# Patient Record
Sex: Male | Born: 1990 | Race: White | Hispanic: No | Marital: Single | Smoking: Never smoker
Health system: Southern US, Community
[De-identification: ages and names within clinical notes are randomized; demographics above are authoritative.]

## PROBLEM LIST (undated history)

## (undated) DIAGNOSIS — I10 Essential (primary) hypertension: Secondary | ICD-10-CM

---

## 2015-08-28 ENCOUNTER — Encounter: Payer: Self-pay | Admitting: Emergency Medicine

## 2015-08-28 ENCOUNTER — Emergency Department
Admission: EM | Admit: 2015-08-28 | Discharge: 2015-08-28 | Disposition: A | Discharge status: Home / Self Care | Payer: Worker's Compensation | Attending: Emergency Medicine | Admitting: Emergency Medicine

## 2015-08-28 DIAGNOSIS — Y92410 Unspecified street and highway as the place of occurrence of the external cause: Secondary | ICD-10-CM | POA: Insufficient documentation

## 2015-08-28 DIAGNOSIS — S86811A Strain of other muscle(s) and tendon(s) at lower leg level, right leg, initial encounter: Secondary | ICD-10-CM | POA: Diagnosis not present

## 2015-08-28 DIAGNOSIS — Y998 Other external cause status: Secondary | ICD-10-CM | POA: Diagnosis not present

## 2015-08-28 DIAGNOSIS — Y9389 Activity, other specified: Secondary | ICD-10-CM | POA: Diagnosis not present

## 2015-08-28 DIAGNOSIS — S86111A Strain of other muscle(s) and tendon(s) of posterior muscle group at lower leg level, right leg, initial encounter: Secondary | ICD-10-CM

## 2015-08-28 DIAGNOSIS — S8991XA Unspecified injury of right lower leg, initial encounter: Secondary | ICD-10-CM | POA: Diagnosis present

## 2015-08-28 MED ORDER — METHOCARBAMOL 750 MG PO TABS: 750.0000 mg | ORAL_TABLET | Freq: Four times a day (QID) | ORAL | Status: AC

## 2015-08-28 MED ORDER — NAPROXEN 500 MG PO TABS: 500.0000 mg | ORAL_TABLET | Freq: Two times a day (BID) | ORAL | Status: AC
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
## 2015-08-28 NOTE — Discharge Instructions (Signed)

## 2015-08-28 NOTE — ED Notes (Signed)
Urine drug screen and alcohol breathelizer completed by ED tech Pam.
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
## 2016-11-07 ENCOUNTER — Emergency Department: Payer: Worker's Compensation

## 2016-11-07 ENCOUNTER — Emergency Department
Admission: EM | Admit: 2016-11-07 | Discharge: 2016-11-07 | Disposition: A | Discharge status: Home / Self Care | Payer: Worker's Compensation | Attending: Emergency Medicine | Admitting: Emergency Medicine

## 2016-11-07 DIAGNOSIS — S32030A Wedge compression fracture of third lumbar vertebra, initial encounter for closed fracture: Secondary | ICD-10-CM | POA: Insufficient documentation

## 2016-11-07 DIAGNOSIS — Y990 Civilian activity done for income or pay: Secondary | ICD-10-CM | POA: Diagnosis not present

## 2016-11-07 DIAGNOSIS — M545 Low back pain, unspecified: Secondary | ICD-10-CM

## 2016-11-07 DIAGNOSIS — S3992XA Unspecified injury of lower back, initial encounter: Secondary | ICD-10-CM | POA: Diagnosis present

## 2016-11-07 DIAGNOSIS — S32010A Wedge compression fracture of first lumbar vertebra, initial encounter for closed fracture: Secondary | ICD-10-CM | POA: Insufficient documentation

## 2016-11-07 DIAGNOSIS — I10 Essential (primary) hypertension: Secondary | ICD-10-CM | POA: Diagnosis not present

## 2016-11-07 DIAGNOSIS — Y929 Unspecified place or not applicable: Secondary | ICD-10-CM | POA: Insufficient documentation

## 2016-11-07 DIAGNOSIS — W11XXXA Fall on and from ladder, initial encounter: Secondary | ICD-10-CM | POA: Diagnosis not present

## 2016-11-07 DIAGNOSIS — S32009A Unspecified fracture of unspecified lumbar vertebra, initial encounter for closed fracture: Secondary | ICD-10-CM

## 2016-11-07 DIAGNOSIS — Y9389 Activity, other specified: Secondary | ICD-10-CM | POA: Insufficient documentation

## 2016-11-07 DIAGNOSIS — S32020A Wedge compression fracture of second lumbar vertebra, initial encounter for closed fracture: Secondary | ICD-10-CM | POA: Insufficient documentation

## 2016-11-07 DIAGNOSIS — Q7649 Other congenital malformations of spine, not associated with scoliosis: Secondary | ICD-10-CM

## 2016-11-07 HISTORY — DX: Essential (primary) hypertension: I10

## 2016-11-07 LAB — LIPASE, BLOOD: LIPASE: 19 U/L (ref 11–51)

## 2016-11-07 LAB — COMPREHENSIVE METABOLIC PANEL
ALT: 71 U/L — ABNORMAL HIGH (ref 17–63)
AST: 58 U/L — AB (ref 15–41)
Albumin: 5.1 g/dL — ABNORMAL HIGH (ref 3.5–5.0)
Alkaline Phosphatase: 73 U/L (ref 38–126)
Anion gap: 14 (ref 5–15)
BUN: 27 mg/dL — ABNORMAL HIGH (ref 6–20)
CHLORIDE: 108 mmol/L (ref 101–111)
CO2: 20 mmol/L — ABNORMAL LOW (ref 22–32)
Calcium: 9.6 mg/dL (ref 8.9–10.3)
Creatinine, Ser: 1.18 mg/dL (ref 0.61–1.24)
Glucose, Bld: 114 mg/dL — ABNORMAL HIGH (ref 65–99)
POTASSIUM: 3.9 mmol/L (ref 3.5–5.1)
Sodium: 142 mmol/L (ref 135–145)
Total Bilirubin: 0.7 mg/dL (ref 0.3–1.2)
Total Protein: 8.2 g/dL — ABNORMAL HIGH (ref 6.5–8.1)

## 2016-11-07 LAB — URINALYSIS, COMPLETE (UACMP) WITH MICROSCOPIC
BILIRUBIN URINE: NEGATIVE
Bacteria, UA: NONE SEEN
Glucose, UA: NEGATIVE mg/dL
HGB URINE DIPSTICK: NEGATIVE
Ketones, ur: NEGATIVE mg/dL
LEUKOCYTES UA: NEGATIVE
Nitrite: NEGATIVE
PROTEIN: 30 mg/dL — AB
SPECIFIC GRAVITY, URINE: 1.025 (ref 1.005–1.030)
SQUAMOUS EPITHELIAL / LPF: NONE SEEN
pH: 5 (ref 5.0–8.0)

## 2016-11-07 LAB — CBC
HCT: 47.4 % (ref 40.0–52.0)
Hemoglobin: 16.5 g/dL (ref 13.0–18.0)
MCH: 31 pg (ref 26.0–34.0)
MCHC: 34.9 g/dL (ref 32.0–36.0)
MCV: 88.7 fL (ref 80.0–100.0)
PLATELETS: 153 10*3/uL (ref 150–440)
RBC: 5.34 MIL/uL (ref 4.40–5.90)
RDW: 12.6 % (ref 11.5–14.5)
WBC: 26.2 10*3/uL — AB (ref 3.8–10.6)

## 2016-11-07 MED ORDER — HYDROCODONE-ACETAMINOPHEN 5-325 MG PO TABS: 1.0000 | ORAL_TABLET | Freq: Four times a day (QID) | ORAL | 0 refills | Status: AC | PRN

## 2016-11-07 MED ORDER — KETOROLAC TROMETHAMINE 30 MG/ML IJ SOLN
30.0000 mg | Freq: Once | INTRAMUSCULAR | Status: AC
  Administered 2016-11-07: 30 mg via INTRAVENOUS
  Filled 2016-11-07: qty 1

## 2016-11-07 MED ORDER — IOPAMIDOL (ISOVUE-300) INJECTION 61%
100.0000 mL | Freq: Once | INTRAVENOUS | Status: AC | PRN
  Administered 2016-11-07: 100 mL via INTRAVENOUS

## 2016-11-07 MED ORDER — MORPHINE SULFATE (PF) 4 MG/ML IV SOLN
4.0000 mg | Freq: Once | INTRAVENOUS | Status: AC
  Administered 2016-11-07: 4 mg via INTRAVENOUS
  Filled 2016-11-07: qty 1

## 2016-11-07 MED ORDER — ONDANSETRON HCL 4 MG/2ML IJ SOLN
4.0000 mg | Freq: Once | INTRAMUSCULAR | Status: AC
  Administered 2016-11-07: 4 mg via INTRAVENOUS
  Filled 2016-11-07: qty 2

## 2016-11-07 NOTE — ED Notes (Signed)
Ronalee Belts with [employer] here to apply TLSO brace.

## 2016-11-07 NOTE — ED Triage Notes (Signed)
Pt fell from roof at work, straight onto back. Pt brought in by [facility] in c collar. Pt only complaint is lower back dull pain. No LOC. Denies neck pain. Pt able to move all extremities with ease. Pt alert and oriented X4, active, cooperative, pt in NAD. RR even and unlabored, color WNL.

## 2016-11-07 NOTE — ED Notes (Signed)
Pt in ct scan  

## 2016-11-07 NOTE — ED Notes (Signed)
Pt remains in c-collar.

## 2016-11-07 NOTE — ED Notes (Signed)
ED Provider at bedside. 

## 2016-11-07 NOTE — ED Notes (Signed)
Call placed to supervisor at [employer] to page ortho tech for TLSO brace. Po fluids provided to pt. Sarah, ed tech in to perform UDS.

## 2016-11-07 NOTE — ED Notes (Signed)
Pt's suppervisor, dean now [employer] is requiring "blood work" for [employer] claim. Supervisor is unsure of blood work needs, states "they said they want a drug screen and some alcohol". Pt's HR supervisor is present on phone speaking with insurance company to clarify what is needed.

## 2016-11-07 NOTE — ED Notes (Signed)
Report from ally, rn.  

## 2016-11-07 NOTE — ED Notes (Addendum)
Pt supervisor, Pat Patrick contacted number [phone]. States that no UDS or breath analysis needed for patient to file workers comp.  Pt workers for [employer].

## 2016-11-07 NOTE — ED Notes (Signed)
c collar removed by EDP. No pain to neck. Full ROM of neck

## 2016-11-07 NOTE — ED Provider Notes (Signed)
[facility] Emergency Department Provider Note   ____________________________________________   First MD Initiated Contact with Patient 11/07/16 1747     (approximate)  I have reviewed the triage vital signs and the nursing notes.   HISTORY  Chief Complaint Fall    HPI Juan Craig is a 26 y.o. male presents for evaluation after falling about 10 feet off a ladder.  Patient reports that he was getting off a roof, he is about 1 story up when the ladder he was on came loose, he fell onto his back onto the ground. He struck on his buttock and lower back region. He did not strike his head or neck. Denies pain in his upper back or chest. No abdominal pain.  He has not had any alcohol or drugs today. Denies any numbness or tingling. No weakness in his arms or legs. No trouble urinating.  Patient reports a moderate 6 out of 10 pain in his lower back whenever he tries to move.   Past Medical History:  Diagnosis Date  . Hypertension     There are no active problems to display for this patient.   History reviewed. No pertinent surgical history.  Prior to Admission medications   Medication Sig Start Date End Date Taking? Authorizing Provider  HYDROcodone-acetaminophen (NORCO/VICODIN) 5-325 MG tablet Take 1-2 tablets by mouth every 6 (six) hours as needed for moderate pain. 11/07/16   Delman Kitten, MD  methocarbamol (ROBAXIN-750) 750 MG tablet Take 1 tablet (750 mg total) by mouth 4 (four) times daily. Patient not taking: Reported on 11/07/2016 08/28/15   Sable Feil, PA-C  naproxen (NAPROSYN) 500 MG tablet Take 1 tablet (500 mg total) by mouth 2 (two) times daily with a meal. Patient not taking: Reported on 11/07/2016 08/28/15   Sable Feil, PA-C    Allergies Patient has no known allergies.  No family history on file.  Social History Social History  Substance Use Topics  . Smoking status: Never Smoker  . Smokeless tobacco: Never Used  . Alcohol use  Yes    Review of Systems Constitutional: No fever/chills Eyes: No visual changes. ENT: No sore throat. Cardiovascular: Denies chest pain. Respiratory: Denies shortness of breath. Gastrointestinal: No abdominal pain.  No nausea, no vomiting.  No diarrhea.  No constipation. Genitourinary: Negative for dysuria. Musculoskeletal: See history of present illness Skin: Negative for rash. Neurological: Negative for headaches, focal weakness or numbness.  10-point ROS otherwise negative.  ____________________________________________   PHYSICAL EXAM:  VITAL SIGNS: ED Triage Vitals  Enc Vitals Group     BP 11/07/16 1654 115/73     Pulse Rate 11/07/16 1654 75     Resp 11/07/16 1654 18     Temp 11/07/16 1654 98.2 F (36.8 C)     Temp Source 11/07/16 1654 Oral     SpO2 11/07/16 1654 100 %     Weight 11/07/16 1655 235 lb (106.6 kg)     Height 11/07/16 1655 6' (1.829 m)     Head Circumference --      Peak Flow --      Pain Score 11/07/16 1654 5     Pain Loc --      Pain Edu? --      Excl. in [place]? --     Constitutional: Alert and oriented. Well appearing and in no acute distress. Eyes: Conjunctivae are normal. PERRL. EOMI. Head: Atraumatic. Nose: No congestion/rhinnorhea. Mouth/Throat: Mucous membranes are moist.  Oropharynx non-erythematous. Neck: No stridor.  No midline cervical tenderness to palpation. No deformities. With cervical collar removed, the patient has full range of motion of the neck without pain. No pain on axial loading. Cleared by [facility]: Normal rate, regular rhythm. Grossly normal heart sounds.  Good peripheral circulation. Respiratory: Normal respiratory effort.  No retractions. Lungs CTAB. Gastrointestinal: Soft and nontender. No distention.  Musculoskeletal: No lower extremity tenderness nor edema.  No joint effusions. 5 out of 5 strength in both extremities upper and lower. No numbness or tingling in any extremity, hands or feet. 5 out of 5  strength in all major muscle groups. Dorsi and plantar flexion. Intact and normal dorsalis pedis and posterior tibial pulses bilateral. The patient was log rolled, he does report tenderness in the upper portion of the lumbar spine, without notable deformity. Neurologic:  Normal speech and language. No gross focal neurologic deficits are appreciated.Skin:  Skin is warm, dry and intact. No rash noted. Psychiatric: Mood and affect are normal. Speech and behavior are normal.  ____________________________________________   LABS (all labs ordered are listed, but only abnormal results are displayed)  Labs Reviewed  CBC - Abnormal; Notable for the following:       Result Value   WBC 26.2 (*)    All other components within normal limits  COMPREHENSIVE METABOLIC PANEL - Abnormal; Notable for the following:    CO2 20 (*)    Glucose, Bld 114 (*)    BUN 27 (*)    Total Protein 8.2 (*)    Albumin 5.1 (*)    AST 58 (*)    ALT 71 (*)    All other components within normal limits  URINALYSIS, COMPLETE (UACMP) WITH MICROSCOPIC - Abnormal; Notable for the following:    Color, Urine YELLOW (*)    APPearance HAZY (*)    Protein, ur 30 (*)    All other components within normal limits  LIPASE, BLOOD   ____________________________________________  EKG   ____________________________________________  RADIOLOGY  Ct Abdomen Pelvis W Contrast  Result Date: 11/07/2016 CLINICAL DATA:  Lower back pain after fall greater than 10 feet. Initial encounter. EXAM: CT ABDOMEN AND PELVIS WITH CONTRAST TECHNIQUE: Multidetector CT imaging of the abdomen and pelvis was performed using the standard protocol following bolus administration of intravenous contrast. CONTRAST:  143mL ISOVUE-300 IOPAMIDOL (ISOVUE-300) INJECTION 61% COMPARISON:  None. FINDINGS: Lower chest:  No contributory findings. Hepatobiliary: No evidence of injury.No evidence of biliary obstruction or stone. Pancreas: Unremarkable. Spleen: Unremarkable.  Adrenals/Urinary Tract: No evidence of injury. Negative adrenals. No hydronephrosis or stone. Unremarkable bladder. Stomach/Bowel:  No evidence of injury. Vascular/Lymphatic: No acute vascular abnormality. Subtle focal right external iliac irregularity is related to streak artifact based on sagittal reformats. No mass or adenopathy. Reproductive:Negative Other: No ascites or pneumoperitoneum. Musculoskeletal: Superior endplate fractures of L1, L2, and L3 as described on dedicated lumbar spine reformats. Height loss measures up to 30% at L2. 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 No retropulsion. Paravertebral edema about the fractures. IMPRESSION: 1. Superior endplate fractures of L1, L2, and L3 with up to 30% height loss. No bony retropulsion or subluxation. 2. No evidence of intra-abdominal injury. Electronically Signed   By: Monte Fantasia M.D.   On: 11/07/2016 19:37   Ct L-spine No Charge  Result Date: 11/07/2016 CLINICAL DATA:  Fall with back pain.  Initial encounter. EXAM: CT LUMBAR SPINE WITHOUT CONTRAST TECHNIQUE: Reformats of the lumbar spine were generated from abdomen and pelvis CT. COMPARISON:  None. FINDINGS: Segmentation: 5 lumbar  type vertebrae. Alignment: Normal. Vertebrae: Acute superior endplate fractures of L1, L2, and L3. Height loss measures up to 30% at L2. No retropulsion or subluxation. Paraspinal and other soft tissues: Paravertebral edema at the levels of fracture. 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 There may be a central disc protrusion at L4-5. IMPRESSION: 1. Acute superior endplate fractures of L1, L2, and L3. Height loss measures up to 30% at L2. No retropulsion or subluxation. 2. Possible central disc protrusion at L4-5. 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 Electronically Signed   By: Monte Fantasia M.D.   On: 11/07/2016 19:41   Dg Chest Portable 1 View  Result Date: 11/07/2016 CLINICAL DATA:   Fall EXAM: PORTABLE CHEST 1 VIEW COMPARISON:  None. FINDINGS: The heart size and mediastinal contours are within normal limits. Both lungs are clear. The visualized skeletal structures are unremarkable. IMPRESSION: No active disease. Electronically Signed   By: Kerby Moors M.D.   On: 11/07/2016 18:24    ____________________________________________   PROCEDURES  Procedure(s) performed: None  Procedures  Critical Care performed: No  ____________________________________________   INITIAL IMPRESSION / ASSESSMENT AND PLAN / ED COURSE  Pertinent labs & imaging results that were available during my care of the patient were reviewed by me and considered in my medical decision making (see chart for details).    Clinical Course as of Nov 08 2254  Fri Nov 07, 2016  2011 Discussed case with Dr. Arnoldo Morale of neurosurgery at [facility]. He recommends after reviewing the patient's imaging studies that he have a TLSO brace applied to be worn at all times except in the shower. She may follow-up with him in one week in the neurosurgery clinic.  [MQ]  2134 The patient is resting comfortably. Reports his pain is well-controlled. Awaiting TLSO brace to arrive from [place].  I will prescribe the patient a narcotic pain medicine due to their condition which I anticipate will cause at least moderate pain short term. I discussed with the patient safe use of narcotic pain medicines, and that they are not to drive, work in dangerous areas, or ever take more than prescribed (no more than 1 pill every 6 hours). We discussed that this is the type of medication that can be  overdosed on and the risks of this type of medicine. Patient is very agreeable to only use as prescribed and to never use more than prescribed.   [MQ]    Clinical Course User Index [MQ] Delman Kitten, MD   Patient presents after falling from a ladder about 10 feet. He landed on his back and buttock region. Reports pain in his upper lumbar  region, denies any pain in his thoracic or cervical spine. Denies striking his head or his neck. Reports he fell essentially under his lower back and buttock. No evidence of injury extremities. He is completely neurologically intact. Given the injury, we'll screen for thoracic injury with chest x-ray though very low pretest probability for thoracic spine or chest injury, we'll obtain CT scan abdomen and pelvis including lumbar reconstructions given his lower back pain with an elevated risk for possible spinal injury. No abdominal complaints.  Takes no anticoagulants.  ----------------------------------------- 10:59 PM on 11/07/2016 -----------------------------------------  Patient able to handle it well now a TLSO brace. Reports his pain is very minimal, doesn't require any more pain medicine. Normal and intact strength in the lower extremities. Very careful return precautions advised, and he will follow up closely with Dr.  Jenkins.  Return precautions and treatment recommendations and follow-up discussed with the patient who is agreeable with the plan.   ____________________________________________   FINAL CLINICAL IMPRESSION(S) / ED DIAGNOSES  Final diagnoses:  Low back pain  Defect of endplate of vertebra  Closed fracture of lumbar spine without spinal cord lesion, initial encounter ([place])      NEW MEDICATIONS STARTED DURING THIS VISIT:  New Prescriptions   HYDROCODONE-ACETAMINOPHEN (NORCO/VICODIN) 5-325 MG TABLET    Take 1-2 tablets by mouth every 6 (six) hours as needed for moderate pain.     Note:  This document was prepared using Dragon voice recognition software and may include unintentional dictation errors.     Delman Kitten, MD 11/07/16 2259

## 2016-11-07 NOTE — ED Notes (Signed)
Pt's employer supervisor states pt now needs a UDS, but not other lab work needed.

## 2016-11-07 NOTE — ED Notes (Signed)
Pt would like to file workers comp, tells nurse at this time.

## 2017-03-11 DIAGNOSIS — D2262 Melanocytic nevi of left upper limb, including shoulder: Secondary | ICD-10-CM | POA: Diagnosis not present

## 2017-04-06 DIAGNOSIS — Z0000 Encounter for general adult medical examination without abnormal findings: Secondary | ICD-10-CM | POA: Diagnosis not present

## 2017-04-06 DIAGNOSIS — Z13220 Encounter for screening for lipoid disorders: Secondary | ICD-10-CM | POA: Diagnosis not present

## 2017-04-06 DIAGNOSIS — I10 Essential (primary) hypertension: Secondary | ICD-10-CM | POA: Diagnosis not present

## 2017-04-06 DIAGNOSIS — Z79899 Other long term (current) drug therapy: Secondary | ICD-10-CM | POA: Diagnosis not present

## 2019-03-13 IMAGING — CT CT ABD-PELV W/ CM
2 of 5 series · 16 of 46 positions shown, 18 images · IV contrast (iopamidol)
Comparison: None.

CLINICAL DATA: Lower back pain after fall greater than 10 feet.
Initial encounter.

EXAM:
CT ABDOMEN AND PELVIS WITH CONTRAST
TECHNIQUE: Multidetector CT imaging of the abdomen and pelvis was performed
using the standard protocol following bolus administration of
intravenous contrast.
CONTRAST:  100mL E48NJU-OII IOPAMIDOL (E48NJU-OII) INJECTION 61%

[Series 3: routine abd/pel with · axial · 0.83mm/px · z∈[-317,+148]mm · 13 of 105 slices shown, 15 images]
[im 6/105  soft-tissue]
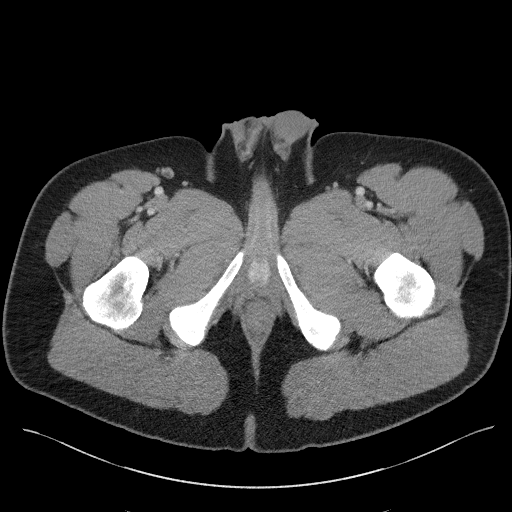
[im 6/105  bone]
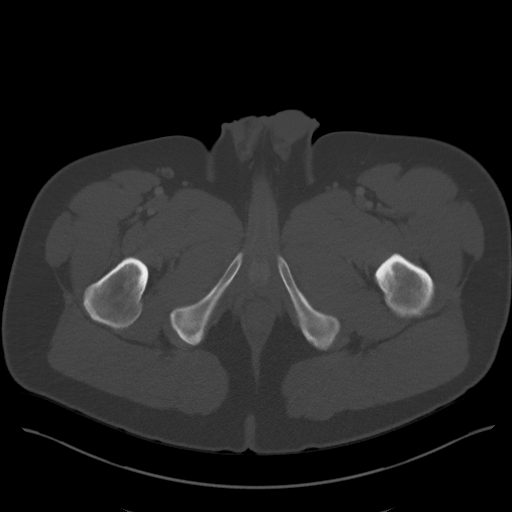
[im 17/105  soft-tissue]
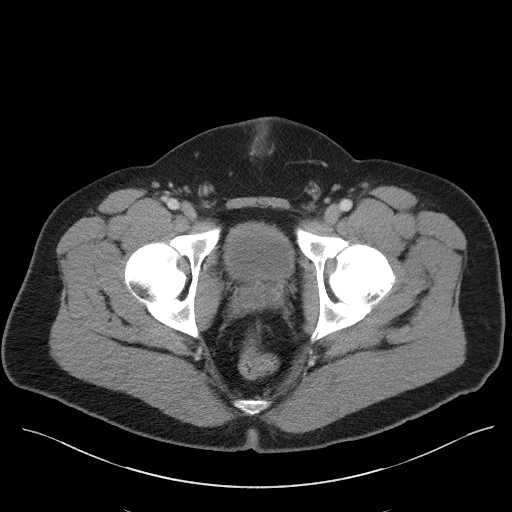
[im 22/105  soft-tissue]
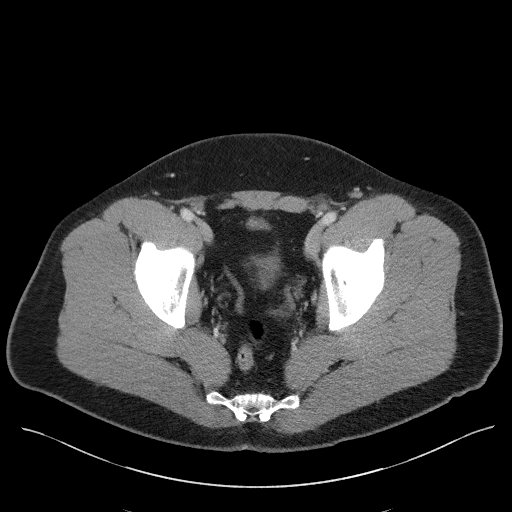
[im 28/105  soft-tissue]
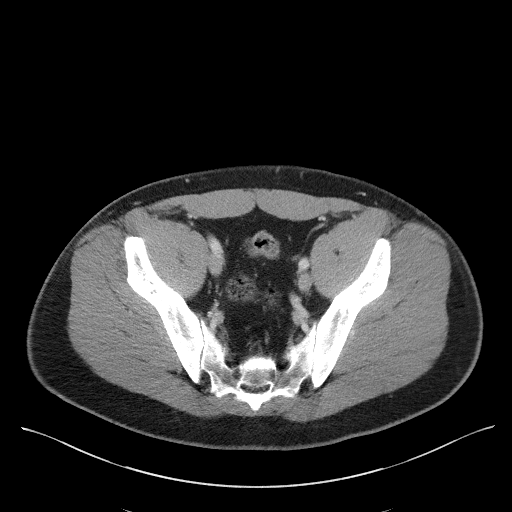
[im 39/105  soft-tissue]
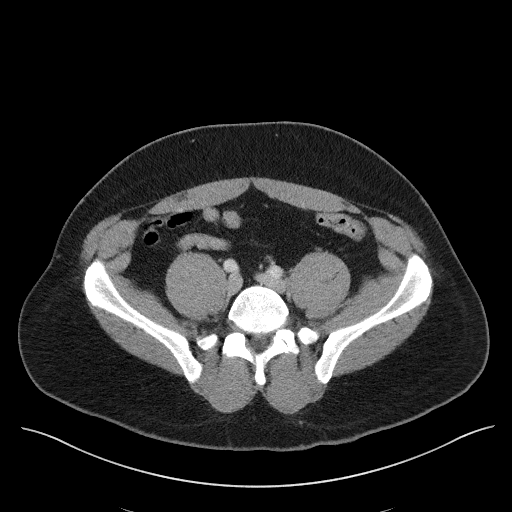
[im 44/105  soft-tissue]
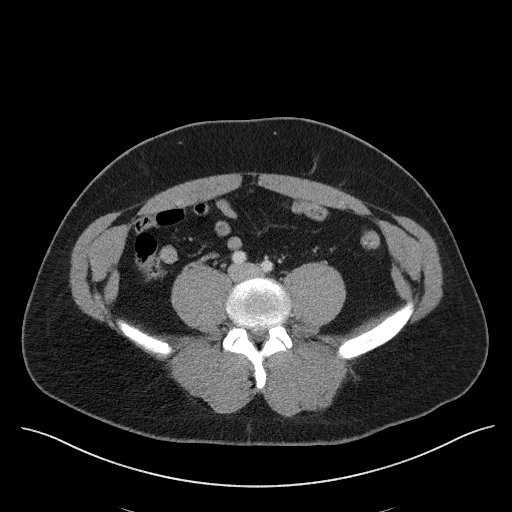
[im 55/105  soft-tissue]
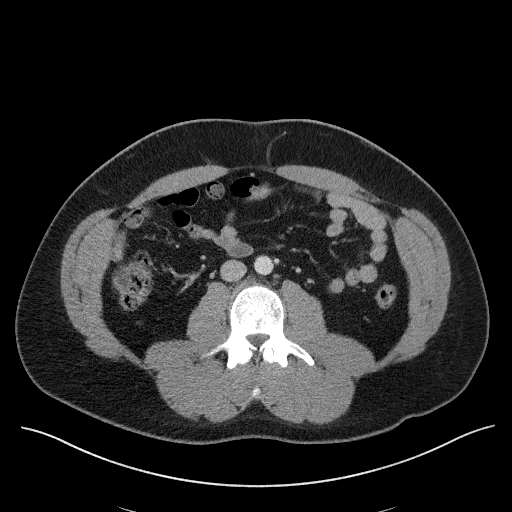
[im 61/105  soft-tissue]
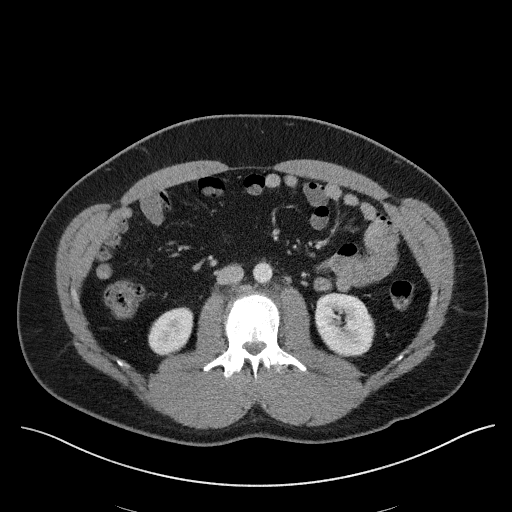
[im 66/105  soft-tissue]
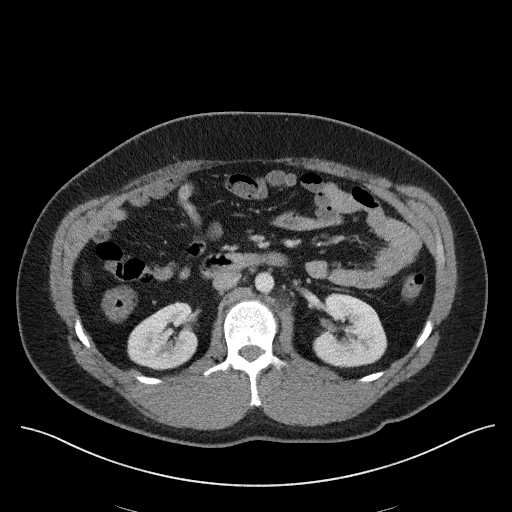
[im 66/105  bone]
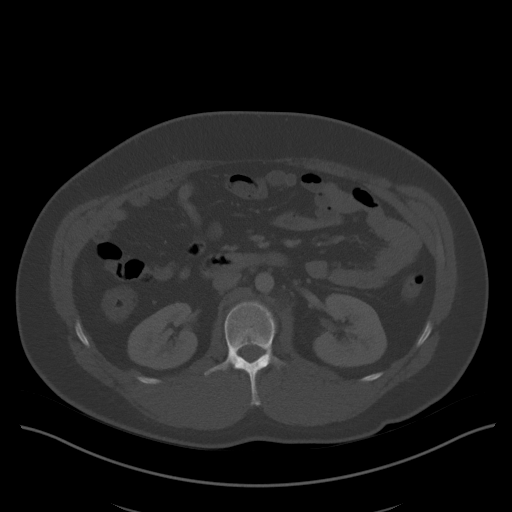
[im 77/105  soft-tissue]
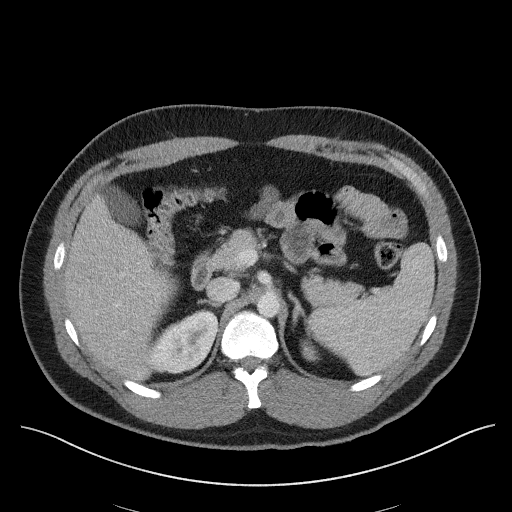
[im 83/105  soft-tissue]
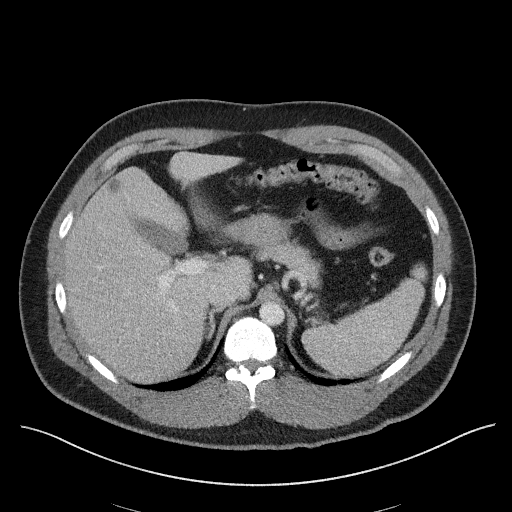
[im 88/105  soft-tissue]
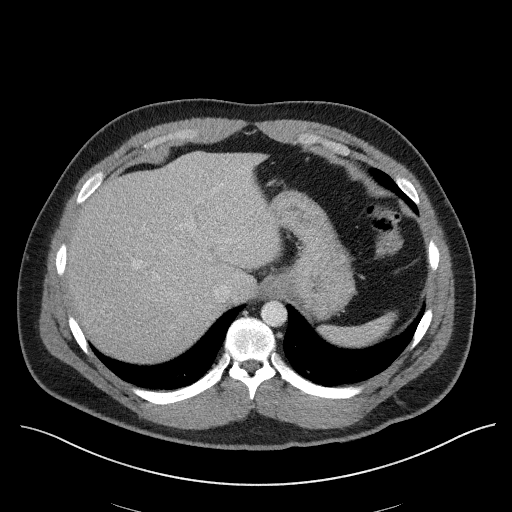
[im 99/105  soft-tissue]
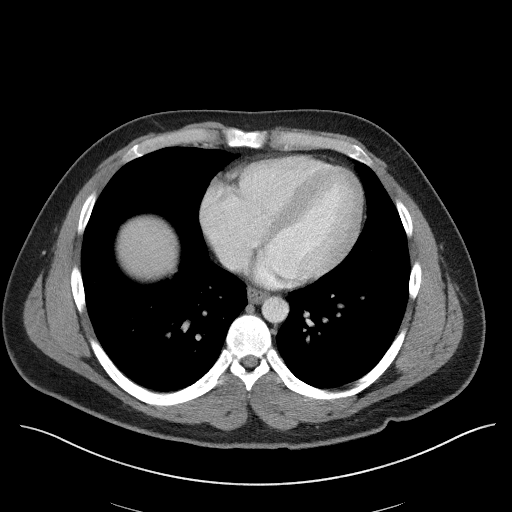

[Series 6: coronal st · coronal · 0.76mm/px · 3 of 94 slices shown]
[im 32/94  soft-tissue]
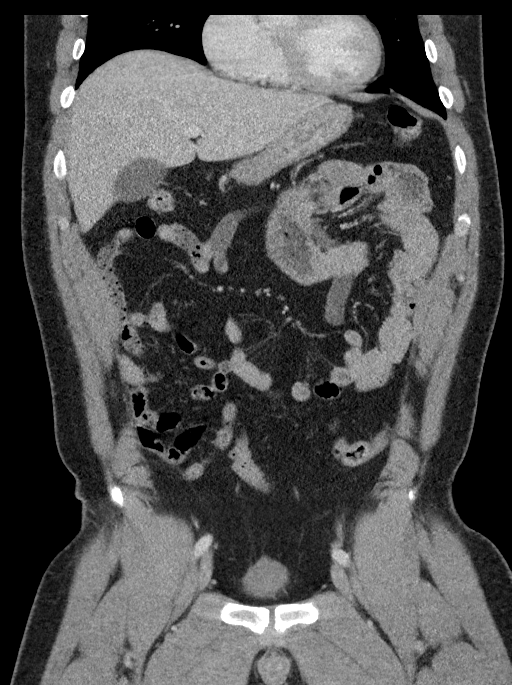
[im 42/94  soft-tissue]
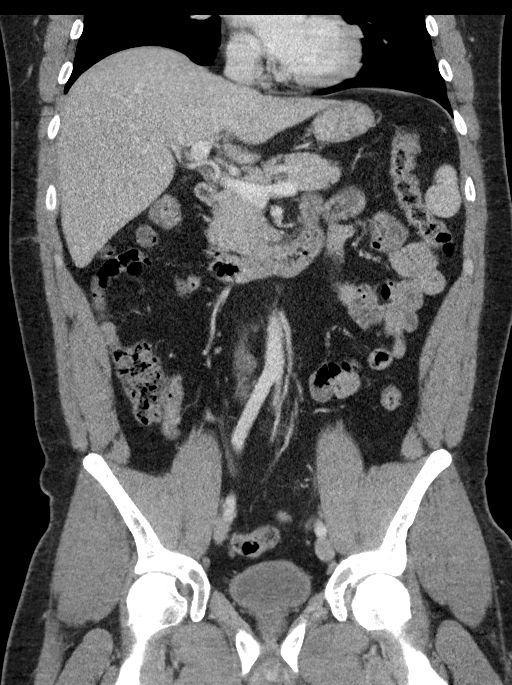
[im 52/94  soft-tissue]
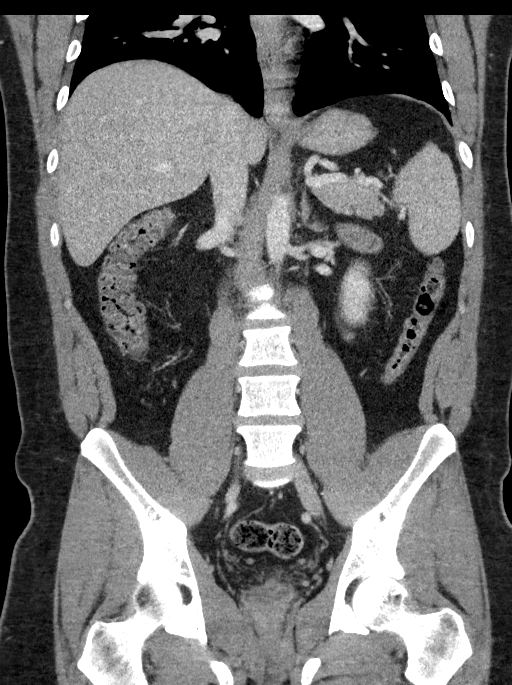

[16 of 46 positions shown; findings below may reference images not displayed]

FINDINGS: Lower chest:  No contributory findings.

Hepatobiliary: No evidence of injury.No evidence of biliary
obstruction or stone.

Pancreas: Unremarkable.

Spleen: Unremarkable.

Adrenals/Urinary Tract: No evidence of injury. Negative adrenals. No
hydronephrosis or stone. Unremarkable bladder.

Stomach/Bowel:  No evidence of injury.

Vascular/Lymphatic: No acute vascular abnormality. Subtle focal
right external iliac irregularity is related to streak artifact
based on sagittal reformats. No mass or adenopathy.

Reproductive:Negative

Other: No ascites or pneumoperitoneum.

Musculoskeletal: Superior endplate fractures of L1, L2, and L3 as
described on dedicated lumbar spine reformats. Height loss measures
up to 30% at L2. Congenitally narrow lumbar spinal canal from short
pedicles. No retropulsion. Paravertebral edema about the fractures.
IMPRESSION: 1. Superior endplate fractures of L1, L2, and L3 with up to 30%
height loss. No bony retropulsion or subluxation.
2. No evidence of intra-abdominal injury.

## 2023-09-14 ENCOUNTER — Other Ambulatory Visit: Payer: Self-pay | Admitting: Orthopedic Surgery

## 2023-09-14 ENCOUNTER — Encounter: Payer: Self-pay | Admitting: Orthopedic Surgery

## 2023-09-14 DIAGNOSIS — Z01818 Encounter for other preprocedural examination: Secondary | ICD-10-CM

## 2023-09-14 DIAGNOSIS — M25521 Pain in right elbow: Secondary | ICD-10-CM

## 2023-09-17 ENCOUNTER — Ambulatory Visit
Admission: RE | Admit: 2023-09-17 | Discharge: 2023-09-17 | Disposition: A | Discharge status: Home / Self Care | Payer: Self-pay | Source: Ambulatory Visit | Attending: Orthopedic Surgery | Admitting: Orthopedic Surgery

## 2023-09-17 DIAGNOSIS — M25521 Pain in right elbow: Secondary | ICD-10-CM

## 2023-09-19 ENCOUNTER — Ambulatory Visit
Admission: RE | Admit: 2023-09-19 | Discharge: 2023-09-19 | Disposition: A | Discharge status: Home / Self Care | Payer: Self-pay | Source: Ambulatory Visit | Attending: Orthopedic Surgery | Admitting: Orthopedic Surgery

## 2023-09-19 DIAGNOSIS — M25521 Pain in right elbow: Secondary | ICD-10-CM
